# Patient Record
Sex: Male | Born: 1980 | Race: White | Hispanic: No | Marital: Married | Smoking: Never smoker
Health system: Southern US, Community
[De-identification: ages and names within clinical notes are randomized; demographics above are authoritative.]

## PROBLEM LIST (undated history)

## (undated) DIAGNOSIS — G479 Sleep disorder, unspecified: Secondary | ICD-10-CM

## (undated) DIAGNOSIS — M47812 Spondylosis without myelopathy or radiculopathy, cervical region: Principal | ICD-10-CM

## (undated) DIAGNOSIS — R42 Dizziness and giddiness: Secondary | ICD-10-CM

## (undated) DIAGNOSIS — G4709 Other insomnia: Secondary | ICD-10-CM

## (undated) HISTORY — PX: TYMPANOSTOMY TUBE PLACEMENT: SHX32

---

## 2008-05-26 ENCOUNTER — Emergency Department (HOSPITAL_COMMUNITY): Admission: EM | Admit: 2008-05-26 | Discharge: 2008-05-26 | Payer: Self-pay | Admitting: Emergency Medicine

## 2011-01-11 NOTE — Op Note (Signed)
[name], [name]                 ACCOUNT NO.:  [id number]   MEDICAL RECORD NO.:  [id number]          PATIENT TYPE:  EMS   LOCATION:  ED                           FACILITY:  [facility]   PHYSICIAN:  John C. Madilyn Fireman, M.D.    DATE OF BIRTH:  1980-10-16   DATE OF PROCEDURE:  05/26/2008  DATE OF DISCHARGE:  05/26/2008                               OPERATIVE REPORT   Esophagogastroduodenoscopy with foreign body removal.   INDICATIONS FOR PROCEDURE:  Dysphagia and sensation of obstructed  esophagus while eating roast beef tonight.   PROCEDURE:  The patient was placed in the left lateral decubitus  position and placed on the pulse monitor with continuous low-flow oxygen  delivered by nasal cannula.  He was sedated with 75 mcg IV fentanyl and  10 mg IV Versed.  Olympus video endoscope was advanced under direct  vision into the oropharynx and esophagus.  The esophagus was straight  and of normal caliber proximally with solid food bolus obstructing the  distal esophagus.  Attempt was made to remove it with the snare, but it  did not remove.  Also, a Roth basket was placed, and initially I was  unable to ensnare it.  However, with pressure on the food bolus, it  finally passed into the GE junction and into the stomach.  There  appeared to be a widely patent lower esophageal ring and some general  friability in the area.  The food bolus was clearly seen in the stomach.  Cursory inspection of the stomach was unremarkable.  The esophagus was  carefully inspected and, although somewhat friable where the food bolus  had been, only a widely patent lower esophageal ring seen with no other  significant food in the esophagus.  Scope was then withdrawn and the  patient returned to the recovery room in stable condition.  He tolerated  the procedure well.  There were no immediate complications.   IMPRESSION:  Impacted food bolus in the esophagus, dislodged with the  endoscope.   PLAN:  Clear liquid diet for  12 hours and caution regarding chewing  solid food well before swallowing in the future.  If this occurs in the  future, will probably need esophageal dilatation.           ______________________________  Everardo All Madilyn Fireman, M.D.     [facility]MED[facility]  D:  05/26/2008  T:  05/27/2008  Job:  [id number]

## 2017-06-02 ENCOUNTER — Emergency Department (HOSPITAL_BASED_OUTPATIENT_CLINIC_OR_DEPARTMENT_OTHER)
Admission: EM | Admit: 2017-06-02 | Discharge: 2017-06-02 | Disposition: A | Discharge status: Home / Self Care | Payer: 59 | Attending: Emergency Medicine | Admitting: Emergency Medicine

## 2017-06-02 ENCOUNTER — Emergency Department (HOSPITAL_BASED_OUTPATIENT_CLINIC_OR_DEPARTMENT_OTHER): Payer: 59

## 2017-06-02 ENCOUNTER — Encounter (HOSPITAL_BASED_OUTPATIENT_CLINIC_OR_DEPARTMENT_OTHER): Payer: Self-pay | Admitting: *Deleted

## 2017-06-02 DIAGNOSIS — M545 Low back pain: Secondary | ICD-10-CM | POA: Diagnosis not present

## 2017-06-02 DIAGNOSIS — R1031 Right lower quadrant pain: Secondary | ICD-10-CM

## 2017-06-02 LAB — CBC WITH DIFFERENTIAL/PLATELET
BASOS ABS: 0 10*3/uL (ref 0.0–0.1)
BASOS PCT: 0 %
EOS ABS: 0.3 10*3/uL (ref 0.0–0.7)
EOS PCT: 4 %
HCT: 48.1 % (ref 39.0–52.0)
Hemoglobin: 16.8 g/dL (ref 13.0–17.0)
Lymphocytes Relative: 27 %
Lymphs Abs: 2.1 10*3/uL (ref 0.7–4.0)
MCH: 29.9 pg (ref 26.0–34.0)
MCHC: 34.9 g/dL (ref 30.0–36.0)
MCV: 85.7 fL (ref 78.0–100.0)
MONO ABS: 0.7 10*3/uL (ref 0.1–1.0)
Monocytes Relative: 9 %
Neutro Abs: 4.7 10*3/uL (ref 1.7–7.7)
Neutrophils Relative %: 60 %
PLATELETS: 199 10*3/uL (ref 150–400)
RBC: 5.61 MIL/uL (ref 4.22–5.81)
RDW: 13.2 % (ref 11.5–15.5)
WBC: 7.9 10*3/uL (ref 4.0–10.5)

## 2017-06-02 LAB — COMPREHENSIVE METABOLIC PANEL
ALBUMIN: 4.4 g/dL (ref 3.5–5.0)
ALK PHOS: 38 U/L (ref 38–126)
ALT: 40 U/L (ref 17–63)
ANION GAP: 6 (ref 5–15)
AST: 25 U/L (ref 15–41)
BILIRUBIN TOTAL: 0.9 mg/dL (ref 0.3–1.2)
BUN: 14 mg/dL (ref 6–20)
CALCIUM: 9.3 mg/dL (ref 8.9–10.3)
CO2: 26 mmol/L (ref 22–32)
CREATININE: 1.13 mg/dL (ref 0.61–1.24)
Chloride: 103 mmol/L (ref 101–111)
GFR calc Af Amer: >60 mL/min (ref >60)
GFR calc non Af Amer: >60 mL/min (ref >60)
GLUCOSE: 93 mg/dL (ref 65–99)
Potassium: 4 mmol/L (ref 3.5–5.1)
Sodium: 135 mmol/L (ref 135–145)
TOTAL PROTEIN: 7.7 g/dL (ref 6.5–8.1)

## 2017-06-02 LAB — URINALYSIS, ROUTINE W REFLEX MICROSCOPIC
BILIRUBIN URINE: NEGATIVE
Glucose, UA: NEGATIVE mg/dL
Hgb urine dipstick: NEGATIVE
Ketones, ur: NEGATIVE mg/dL
Leukocytes, UA: NEGATIVE
NITRITE: NEGATIVE
PH: 6.5 (ref 5.0–8.0)
Protein, ur: NEGATIVE mg/dL
SPECIFIC GRAVITY, URINE: 1.01 (ref 1.005–1.030)

## 2017-06-02 MED ORDER — MORPHINE SULFATE (PF) 4 MG/ML IV SOLN
4.0000 mg | Freq: Once | INTRAVENOUS | Status: AC
  Administered 2017-06-02: 4 mg via INTRAVENOUS
  Filled 2017-06-02: qty 1

## 2017-06-02 MED ORDER — LIDOCAINE 5 % EX PTCH: 1.0000 | MEDICATED_PATCH | CUTANEOUS | 0 refills | Status: AC

## 2017-06-02 MED ORDER — IOPAMIDOL (ISOVUE-300) INJECTION 61%
100.0000 mL | Freq: Once | INTRAVENOUS | Status: AC | PRN
  Administered 2017-06-02: 100 mL via INTRAVENOUS

## 2017-06-02 MED ORDER — SODIUM CHLORIDE 0.9 % IV BOLUS (SEPSIS)
1000.0000 mL | Freq: Once | INTRAVENOUS | Status: AC
  Administered 2017-06-02: 1000 mL via INTRAVENOUS

## 2017-06-02 NOTE — ED Triage Notes (Signed)
Pt c/o right lower back pain and right lower abd/ groin pain x 3 days, denies , n/v/d

## 2017-06-02 NOTE — Discharge Instructions (Signed)
Please see the information and instructions below regarding your visit.  Your diagnoses today include:  1. Right lower quadrant pain   2. Right low back pain, unspecified chronicity, with sciatica presence unspecified    Your exam and testing is reassuring today. There were no signs of kidney stones, appendicitis, inflammation your colon, dilation of the kidneys, or problems with the major arteries of the abdomen.  Tests performed today include: See side panel of your discharge paperwork for testing performed today. Vital signs are listed at the bottom of these instructions.   Copies provided.  Medications prescribed:    Take any prescribed medications only as prescribed, and any over the counter medications only as directed on the packaging.  Lidocaine patches. You may try these for low back pain. 1 patch per 24-hour period.  Home care instructions:  Please follow any educational materials contained in this packet.   Please continue warm compresses. Please maintain hydration and ensure that urine is straw colored.  Follow-up instructions: Please follow-up with your primary care provider as soon as possible for further evaluation of your symptoms if they are not completely improved. A resource guide to find a primary care physician is included in your discharge paperwork.  Return instructions:  Please return to the Emergency Department if you experience worsening symptoms.  Please return for any worsening abdominal or back pain, nausea or vomiting that is not improving, fever, or chills in the setting of your pain, urine in your bladder or bowel movements, numbness in your groin, loss of bowel or bladder control, weakness in your lower extremities, numbness in your lower extremities. Please return if you have any other emergent concerns.  Additional Information:   Your vital signs today were: BP (!) 140/100 (BP Location: Left Arm)    Pulse 60    Temp 97.9 F (36.6 C) (Oral)     Resp 18    Ht  (1.854 m)    Wt 97.5 kg (215 lb)    SpO2 99%    BMI 28.37 kg/m  If your blood pressure (BP) was elevated on multiple readings during this visit above 130 for the top number or above 80 for the bottom number, please have this repeated by your primary care provider within one month. --------------  Thank you for allowing [place] to participate in your care today. It was a pleasure taking care of you today.

## 2017-06-02 NOTE — ED Provider Notes (Signed)
[facility] Provider Note   CSN: [id number] Arrival date & time: 06/02/17  0908     History   Chief Complaint Chief Complaint  Patient presents with  . Back Pain    HPI Alan [name] is a 36 y.o. male.  HPI   Patient is a 36 y.o. male with no abdominal surgical history and remote history of food bolus impaction presenting for 3-4 days of sharp right lower back pain and RLQ pain. Pain began with right lower back pain approximately 6 out of 10 and constant in severity. Right lower quadrant pain began one day ago and is intermittent. Patient reports some radiation to the groin. Patient has tried warm compresses, Advil, and a prior prescription for oxycodone last night with minimal relief. Patient reports he had this pain in the past a few years ago, but it resolved spontaneously and he was never evaluated for it. Patient denies any fever, chills, diarrhea changes in bowel movements, melena, hematochezia, testicular pain, dysuria, urgency, frequency, or hematuria. No weakness, numbness, paresthesias in the lower extremities, no saddle anesthesia, no loss of bowel/bladder control or urinary retention. Patient has a istory approximately 10 years ago of grade 1 spondylolisthesis, which was treated with PT and pt had no further sequelae from. No family history of appendicitis  History reviewed. No pertinent past medical history.  There are no active problems to display for this patient.   Past Surgical History:  Procedure Laterality Date  . TYMPANOSTOMY TUBE PLACEMENT         Home Medications    Prior to Admission medications   Not on File    Family History History reviewed. No pertinent family history.  Social History Social History  Substance Use Topics  . Smoking status: Never Smoker  . Smokeless tobacco: Never Used  . Alcohol use No     Allergies   Patient has no known allergies.   Review of Systems Review of Systems  Constitutional: Negative for  chills and fever.  HENT: Positive for congestion and rhinorrhea.   Eyes: Negative for visual disturbance.  Respiratory: Positive for cough. Negative for shortness of breath.        Nonproductive cough  Cardiovascular: Negative for chest pain and leg swelling.  Gastrointestinal: Positive for abdominal pain and constipation. Negative for blood in stool, diarrhea, nausea and vomiting.  Genitourinary: Negative for dysuria, flank pain, frequency, hematuria, testicular pain and urgency.  Musculoskeletal: Negative for back pain and myalgias.  Skin: Negative for rash.  Neurological: Positive for headaches. Negative for weakness and numbness.     Physical Exam Updated Vital Signs BP (!) 140/100 (BP Location: Left Arm)   Pulse 60   Temp 97.9 F (36.6 C) (Oral)   Resp 18   Ht  (1.854 m)   Wt 97.5 kg (215 lb)   SpO2 99%   BMI 28.37 kg/m   Physical Exam  Constitutional: He appears well-developed and well-nourished. No distress.  HENT:  Head: Normocephalic and atraumatic.  Mouth/Throat: Oropharynx is clear and moist.  Eyes: Pupils are equal, round, and reactive to light. Conjunctivae and EOM are normal.  Neck: Normal range of motion. Neck supple.  Cardiovascular: Normal rate, regular rhythm, S1 normal and S2 normal.   No murmur heard. Pulmonary/Chest: Effort normal and breath sounds normal. He has no wheezes. He has no rales.  Abdominal: Soft. He exhibits no distension. There is tenderness. There is no guarding.  Tender to palpation in the right lower quadrant. Palpation of  left lower quadrant produces pain in right lower quadrant. No CVA tenderness.  Musculoskeletal: Normal range of motion. He exhibits no edema or deformity.  Lymphadenopathy:    He has no cervical adenopathy.  Neurological: He is alert.  Cranial nerves grossly intact. Spine Exam: Inspection/Palpation: No tenderness to palpation of midline cervical, thoracic, or lumbar spine. No tenderness to palpation of lumbar  paraspinal musculature. Strength: 5/5 throughout LE bilaterally (hip flexion/extension, adduction/abduction; knee flexion/extension; foot dorsiflexion/plantarflexion, inversion/eversion; great toe inversion) Sensation: Intact to light touch in proximal and distal LE bilaterally Reflexes: 2+ quadriceps reflex and symmetric Gait: Symmetric and with good coordination.  Skin: Skin is warm and dry. No rash noted. No erythema.  Psychiatric: He has a normal mood and affect. His behavior is normal. Judgment and thought content normal.  Nursing note and vitals reviewed.    ED Treatments / Results  Labs (all labs ordered are listed, but only abnormal results are displayed) Labs Reviewed  URINALYSIS, ROUTINE W REFLEX MICROSCOPIC  COMPREHENSIVE METABOLIC PANEL  CBC WITH DIFFERENTIAL/PLATELET    EKG  EKG Interpretation None       Radiology No results found.  Procedures Procedures (including critical care time)  Medications Ordered in ED Medications  sodium chloride 0.9 % bolus 1,000 mL (not administered)  morphine 4 MG/ML injection 4 mg (not administered)     Initial Impression / Assessment and Plan / ED Course  I have reviewed the triage vital signs and the nursing notes.  Pertinent labs & imaging results that were available during my care of the patient were reviewed by me and considered in my medical decision making (see chart for details).  Clinical Course as of Jun 02 1004  Fri Jun 02, 2017  4098 Patient verbally verified a safe ride from the ED. Proceeded with prescribing morphine for pain in the ED.   [AM]    Clinical Course User Index [AM] Elisha Ponder, PA-C    Final Clinical Impressions(s) / ED Diagnoses   Final diagnoses:  None   Differential diagnosis includes nephrolithiasis, appendicitis, pyelonephritis, right-sided diverticulitis, constipation, renal infarct, AAA, low back strain, lumbar disc compression. Patient Greatest suspicion for nephrolithiasis or  appendicitis at this time. Patient is well-appearing, afebrile, and in no acute distress. Morphine for pain control. time. Will obtain labs including contrast enhanced CT.  Patient reevaluated and noted to have improvement with morphine. Patient reported that he had some return of his pain towards the end of the visit in the back, but resolution of the right lower abdominal pain. Patient has a benign abdomen, is afebrile and is well-appearing. CT without any acute abnormality. Shows mild enlarged spleen but in the absence of malaise, hematologic abnormality, or recent illness, do not suspect acute pathology with this finding. No leukocytosis. Normal kidney function, normal liver function, and normal urinalysis. Suspect possible pathology related to history of spondylolisthesis and potential compression of nerves, but no surgical concern today. Patient is neurologically intact in the lower extremities. Patient instructed to follow-up with a primary care provider and establish care. Patient is in understanding and agrees with the plan of care.  This is a shared visit with Dr. Verdie Mosher. Patient was independently evaluated by this attending physician. Attending physician consulted in evaluation and discharge management.  New Prescriptions New Prescriptions   No medications on file     Delia Chimes 06/02/17 1214    Lavera Guise, MD 06/02/17 [phone]

## 2017-06-02 NOTE — ED Provider Notes (Signed)
Medical screening examination/treatment/procedure(s) were conducted as a shared visit with non-physician practitioner(s) and myself.  I personally evaluated the patient during the encounter.   EKG Interpretation None      36 year old male who presents with RLQ abdominal pain and back pain. No prior abdominal surgeries. Onset of right lower back pain 3-4 days ago, now traveling to the RLQ. No n/v/d, fever, dysuria, urinary frequency, hematuria, testicular swelling or testicular pain. Afebrile, well appearing. Abdomen soft and non-peritoneal. Focal RLQ tenderness on abdominal exam, no significant CVA tenderness. UA without blood or infection. Will obtain CT abd/pelvis to evaluate for appendicitis, renal stone, or other acute processes. Blood work pending.  Blood work reassuring. CT visualized. Normal. No evidence of appendicitis, kidney stones or other acute pathology. Given benign exam, patient will continue supportive care with OTC analgesics. To return to ED for worsening or other new concerning symptoms.   Strict return and follow-up instructions reviewed. He expressed understanding of all discharge instructions and felt comfortable with the plan of care.    Lavera Guise, MD 06/02/17 [phone]

## 2017-06-02 NOTE — ED Notes (Signed)
ED Provider at bedside. 

## 2017-06-02 NOTE — ED Notes (Signed)
Patient transported to CT 

## 2017-06-02 NOTE — ED Notes (Signed)
Pt refused wheelchair.  

## 2019-01-18 IMAGING — CT CT ABD-PELV W/ CM
2 of 4 series · 16 of 46 positions shown, 18 images · IV contrast (APPLIED)
Comparison: None.

CLINICAL DATA: Abdominal pain

EXAM:
CT ABDOMEN AND PELVIS WITH CONTRAST
TECHNIQUE: Multidetector CT imaging of the abdomen and pelvis was performed
using the standard protocol following bolus administration of
intravenous contrast. Oral contrast was also administered.
CONTRAST:  100mL 1J19TR-WCC IOPAMIDOL (1J19TR-WCC) INJECTION 61%

[Series 2: axial st · axial · 0.92mm/px · z∈[-599,-84]mm · 13 of 113 slices shown, 15 images]
[im 5/113  soft-tissue]
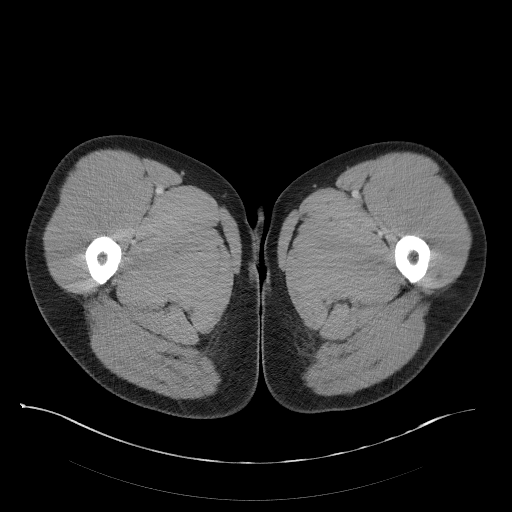
[im 5/113  bone]
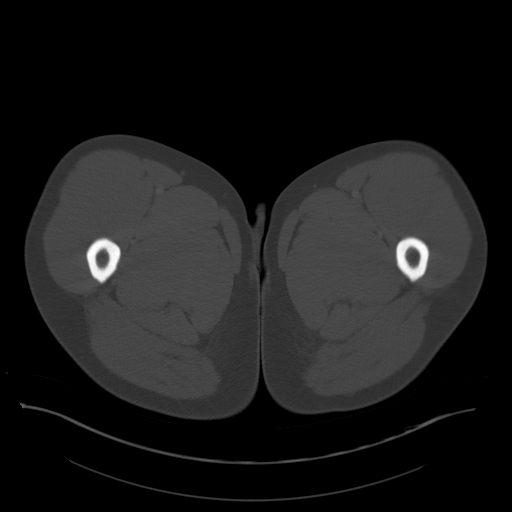
[im 15/113  soft-tissue]
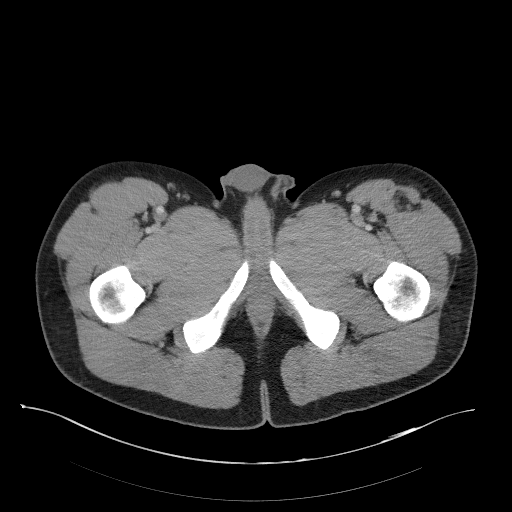
[im 24/113  soft-tissue]
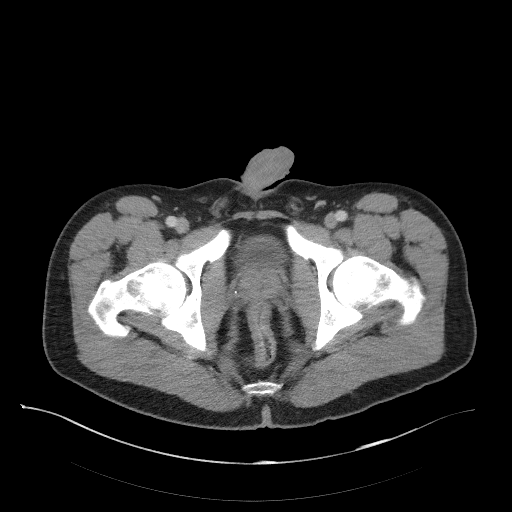
[im 33/113  soft-tissue]
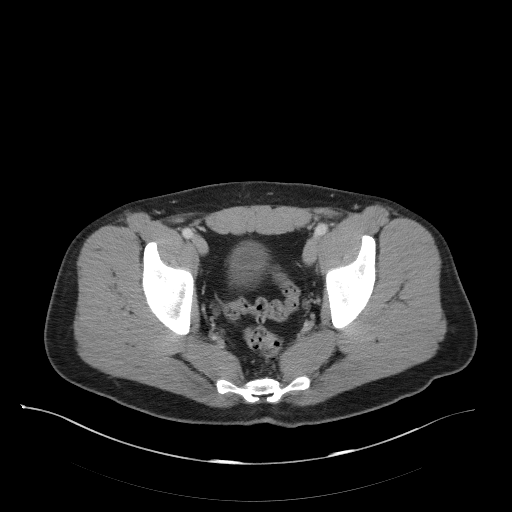
[im 38/113  soft-tissue]
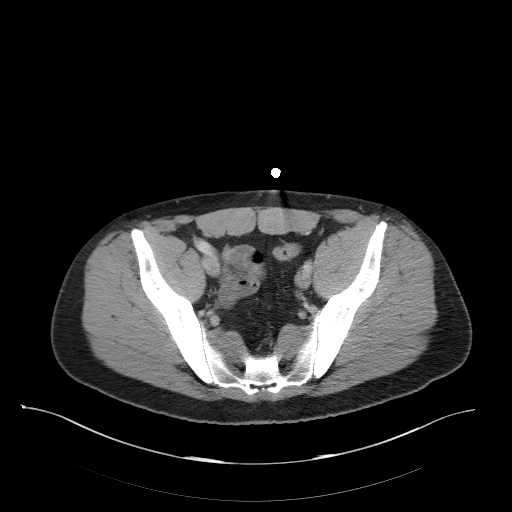
[im 47/113  soft-tissue]
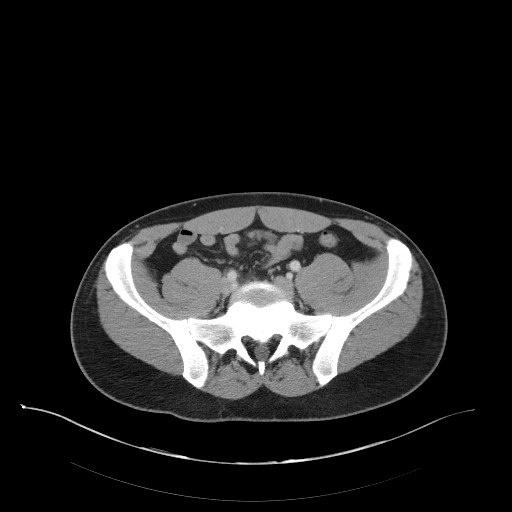
[im 57/113  soft-tissue]
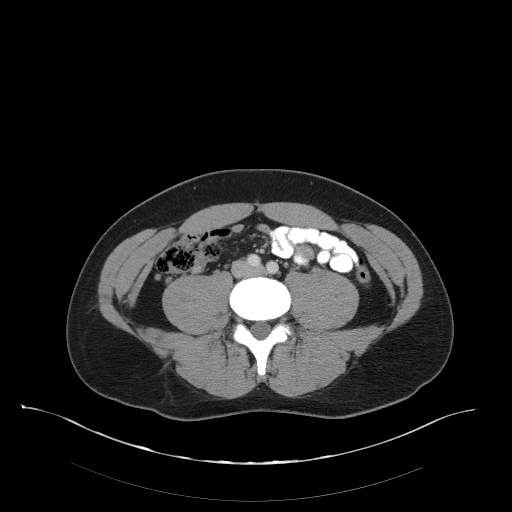
[im 66/113  soft-tissue]
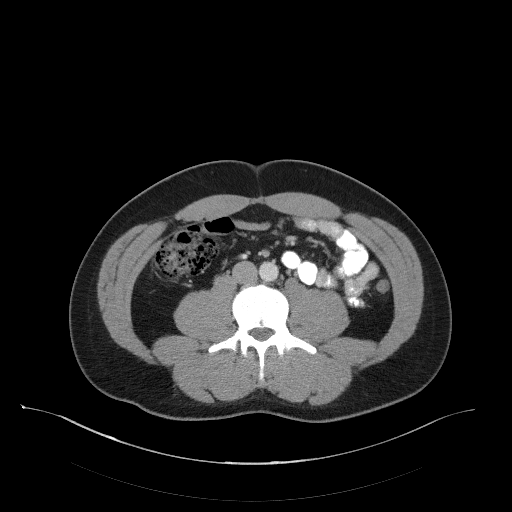
[im 75/113  soft-tissue]
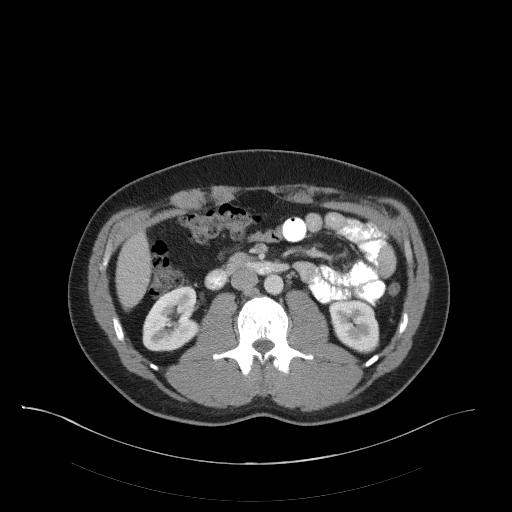
[im 75/113  bone]
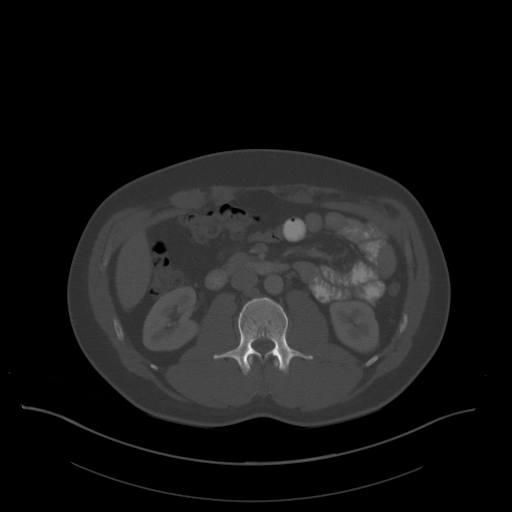
[im 80/113  soft-tissue]
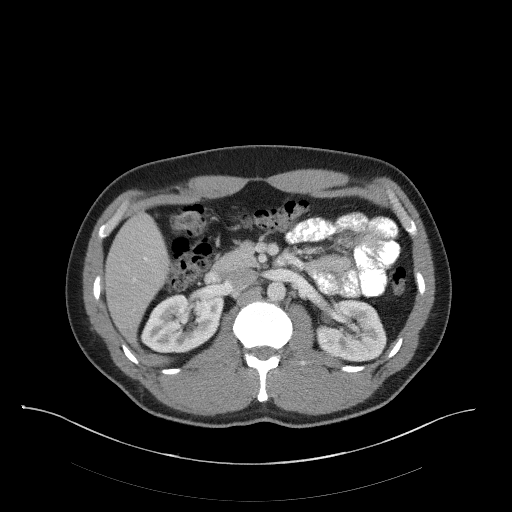
[im 89/113  soft-tissue]
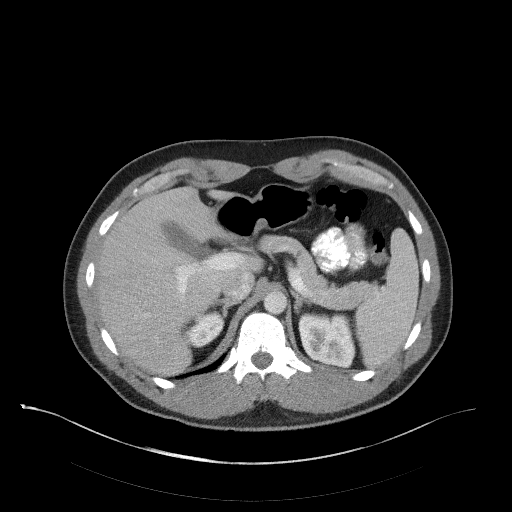
[im 99/113  soft-tissue]
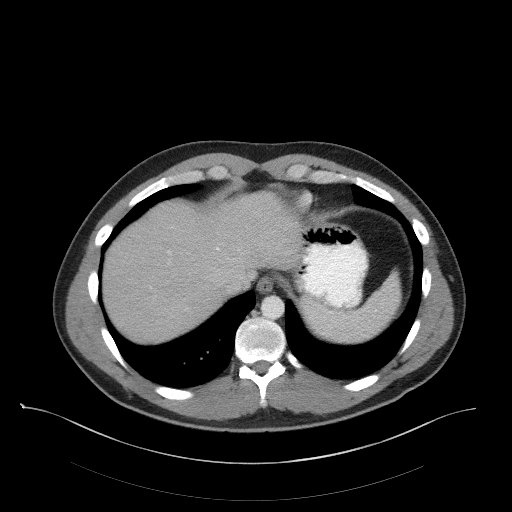
[im 108/113  soft-tissue]
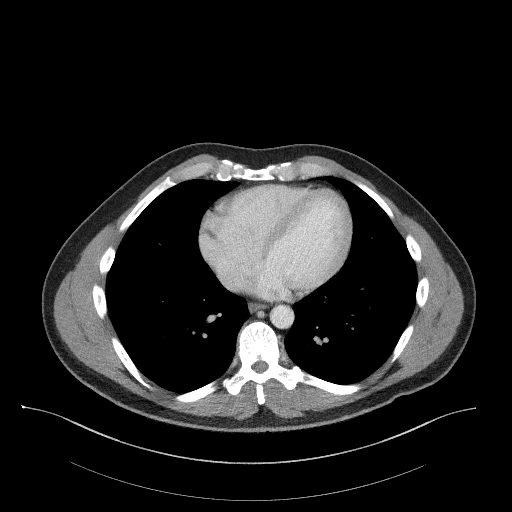

[Series 5: coronal st · coronal · 0.94mm/px · 3 of 81 slices shown]
[im 27/81  soft-tissue]
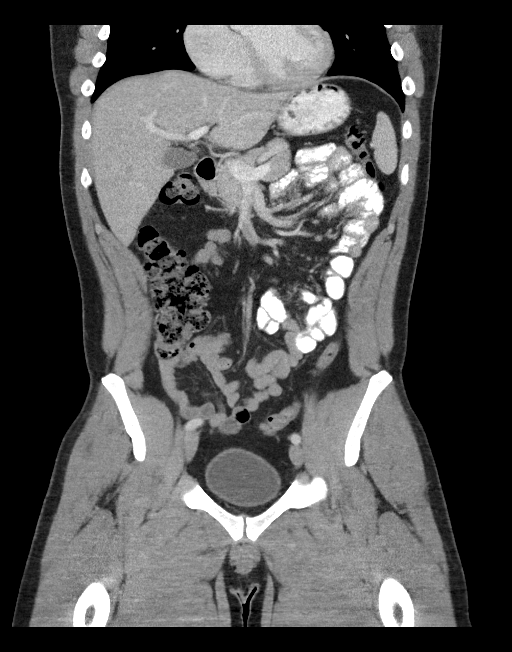
[im 36/81  soft-tissue]
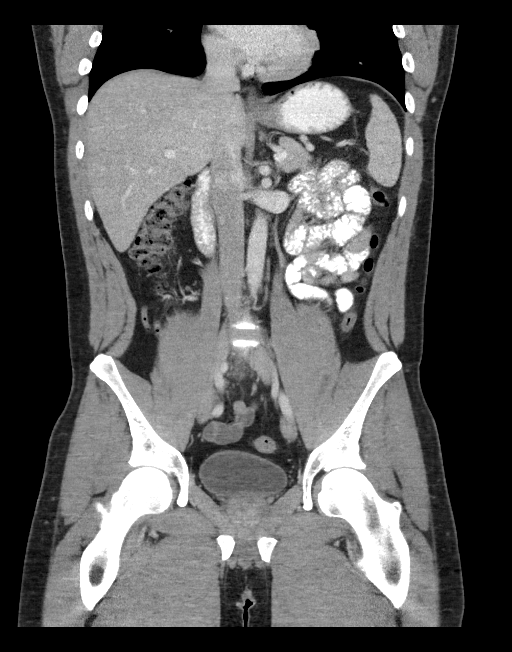
[im 45/81  soft-tissue]
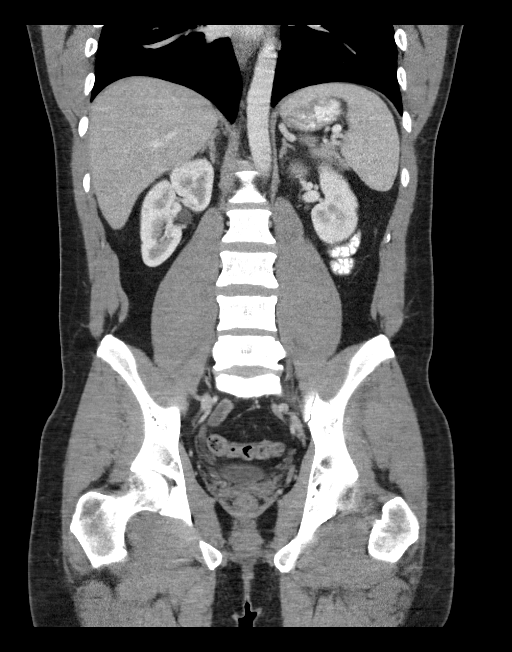

[16 of 46 positions shown; findings below may reference images not displayed]

FINDINGS: Lower chest:  Lung bases are clear.

Hepatobiliary: There is mild fatty infiltration near the fissure for
the ligamentum teres. No focal liver lesions are evident.
Gallbladder wall is not thickened. There is no biliary duct
dilatation.

Pancreas:  There is no pancreatic mass or inflammatory focus.

Spleen: No splenic lesions are appreciable. Spleen measures 13.1 x
12.1 x 5.9 cm with a measured splenic volume of 468 cubic cm.

Adrenals/Urinary Tract: Adrenals appear normal bilaterally. Kidneys
bilaterally show no mass or hydronephrosis on either side. There is
known more ureteral calculus on either side. Urinary bladder is
midline with wall thickness within normal limits.

Stomach/Bowel: There are occasional sigmoid diverticula. No
diverticulitis. There is no bowel wall or mesenteric thickening. No
bowel obstruction. No free air or portal venous air.

Vascular/Lymphatic: No abdominal aortic aneurysm. No vascular
lesions are evident. No adenopathy is appreciable in the abdomen or
pelvis.

Reproductive: Prostate and seminal vesicles are normal in size and
contour. No pelvic masses evident.

Other: Appendix appears normal. There is no abscess or ascites in
the abdomen or pelvis.

Musculoskeletal: No blastic or lytic bone lesions are evident. No
intramuscular or abdominal wall lesion evident.
IMPRESSION: 1.  Spleen mildly prominent without focal splenic lesion.

2. Occasional sigmoid diverticula without diverticulitis. No bowel
obstruction or bowel wall thickening.

3.  No renal or ureteral calculus.  No hydronephrosis.

4.  Appendix appears normal.  No evident abscess.

## 2019-07-08 ENCOUNTER — Ambulatory Visit: Admit: 2019-07-08 | Discharge: 2019-07-08 | Attending: Occupational Health

## 2019-07-08 ENCOUNTER — Ambulatory Visit: Attending: Occupational Health | Primary: Family Medicine

## 2019-07-08 DIAGNOSIS — Z111 Encounter for screening for respiratory tuberculosis: Secondary | ICD-10-CM

## 2019-07-08 NOTE — Progress Notes (Signed)
HISTORY OF PRESENT ILLNESS  Terry Blanchard is a 38 y.o. male.  HPI   Needs a PPD placed for his yearly screening for TB.  He is not on immunosuppressants or steroids.  No past history of TB exposure or positive screening.  ROS    Physical Exam   No allergies or adverse reactions to TB screening  No Known Allergies    ASSESSMENT and PLAN  Screening for TB  Diagnoses and all orders for this visit:    1. Screening for tuberculosis  -     AMB POC TUBERCULOSIS, INTRADERMAL (SKIN TEST)     PPD placed in left Forearm, follow up in 48-72 hours for reading

## 2019-07-08 NOTE — Progress Notes (Signed)
HISTORY OF PRESENT ILLNESS  Terry Blanchard is a 38 y.o. male.  HPI   Needs a PPD placed for his yearly screening for TB.  He is not on immunosuppressants or steroids.  No past history of TB exposure or positive screening.  ROS    Physical Exam   No allergies or adverse reactions to TB screening  No Known Allergies    ASSESSMENT and PLAN  Screening for TB  Diagnoses and all orders for this visit:    1. Screening for tuberculosis  -     AMB POC TUBERCULOSIS, INTRADERMAL (SKIN TEST)     PPD placed in left Forearm, follow up in 48-72 hours for reading

## 2019-11-05 ENCOUNTER — Encounter: Attending: Urology

## 2020-01-29 ENCOUNTER — Ambulatory Visit: Admit: 2020-01-29 | Discharge: 2020-01-29 | Payer: PRIVATE HEALTH INSURANCE | Attending: Urology

## 2020-01-29 ENCOUNTER — Ambulatory Visit: Attending: Urology | Primary: Family Medicine

## 2020-01-29 DIAGNOSIS — Z3009 Encounter for other general counseling and advice on contraception: Secondary | ICD-10-CM

## 2020-01-29 MED ORDER — DIAZEPAM 10 MG TAB
10 mg | ORAL_TABLET | ORAL | 0 refills | Status: AC
Start: 2020-01-29 — End: ?

## 2020-01-29 NOTE — Progress Notes (Signed)
[facility]  [address]  [place], [place] [address]  [phone]    Terry Blanchard  DOB: 1981-07-24      Chief Complaint   Patient presents with   ??? New Patient   ??? Vasectomy   ??? Counseling        HPI   39 y.o., male who is referred by self for family planning.  He has four kids.  Remarried.  Vascular rep.      No past medical history on file.  No past surgical history on file.    No Known Allergies  Social History     Socioeconomic History   ??? Marital status: MARRIED     Spouse name: Not on file   ??? Number of children: Not on file   ??? Years of education: Not on file   ??? Highest education level: Not on file   Occupational History   ??? Not on file   Tobacco Use   ??? Smoking status: Not on file   Substance and Sexual Activity   ??? Alcohol use: Not on file   ??? Drug use: Not on file   ??? Sexual activity: Not on file   Other Topics Concern   ??? Not on file   Social History Narrative   ??? Not on file     Social Determinants of Health     Financial Resource Strain:    ??? Difficulty of Paying Living Expenses:    Food Insecurity:    ??? Worried About Programme researcher, broadcasting/film/video in the Last Year:    ??? Barista in the Last Year:    Transportation Needs:    ??? Freight forwarder (Medical):    ??? Lack of Transportation (Non-Medical):    Physical Activity:    ??? Days of Exercise per Week:    ??? Minutes of Exercise per Session:    Stress:    ??? Feeling of Stress :    Social Connections:    ??? Frequency of Communication with Friends and Family:    ??? Frequency of Social Gatherings with Friends and Family:    ??? Attends Religious Services:    ??? Database administrator or Organizations:    ??? Attends Engineer, structural:    ??? Marital Status:    Intimate Programme researcher, broadcasting/film/video Violence:    ??? Fear of Current or Ex-Partner:    ??? Emotionally Abused:    ??? Physically Abused:    ??? Sexually Abused:      No family history on file.      Review of Systems  Constitutional: Negative  Skin: Negative skin ROS  Eyes: Eyes negative  ENT: HENT negative   Respiratory: Respiratory negative  Cardiovascular: Neg cardio ROS  GI: Neg GI ROS  Genitourinary: Genitourinary negative  Musculoskeletal: Positive for neck pain.  Neurological: Neg neuro ROS  Psychological: Neg psych ROS  Endocrine: Endocrine negative  Hem/Lymphatic: Hematologic/lymphatic negative        Physical Exam  Visit Vitals  BP 136/88   Pulse 67   Ht 6\' 1"  (1.854 m)   Wt 222 lb (100.7 kg)   BMI 29.29 kg/m??     General appearance - alert, well appearing, and in no distress  Mental status - alert, oriented to person, place, and time  Eyes - extraocular eye movements intact, sclera anicteric  GU- both vas palpable  Neurological -  normal speech, no focal findings or movement disorder noted  Skin -  normal coloration and turgor      Assessment/Plan    ICD-10-CM ICD-9-CM    1. Encounter for vasectomy counseling  Z30.09 V25.09      All risks, benefits and alternatives to the procedure were reviewed in detail with the patient and he is willing to proceed with a vasectomy.  I did inform him that he is not cleared to discontinue contraception until he produces at least one semen analysis with azoospermia or rare non motile sperm following his vasectomy.  I recommended that he bring his first post vasectomy semen specimen into the office no sooner than 8 weeks post op .  I also informed him that this must be considered a permanent procedure.  Should fertility be desired post vasectomy his options include vasectomy reversal and/or sperm retrieval.  I explained that these options can be expensive and not always successful.   We discussed possible side effects during and following his vasectomy which include but are not limited to; bleeding, infection, hydrocele, sperm granuloma, acute pain, chronic pain, and/or testicular atrophy.  I discussed the possibility performing a unilateral vasectomy twice which would lead to persistent sperm and the need for a redo vasectomy.  I also discussed the risk of recanalization and  the risk of pregnancy despite initial clearance of sperm and approval to stop contraception.  Rx provided for Valium 10mg  tab #1 to be taken one hr prior to the procedure.  Pt was instructed not to drive on this medication.    Terry Booze [name], DO

## 2020-01-29 NOTE — Progress Notes (Signed)
[facility] [place] [address]  [phone]    Terry Blanchard  DOB: 1980-09-27      Chief Complaint   Patient presents with   ??? New Patient   ??? Vasectomy   ??? Counseling        HPI   39 y.o., male who is referred by self for family planning.  He has four kids.  Remarried.  Vascular rep.      No past medical history on file.  No past surgical history on file.    No Known Allergies  Social History     Socioeconomic History   ??? Marital status: MARRIED     Spouse name: Not on file   ??? Number of children: Not on file   ??? Years of education: Not on file   ??? Highest education level: Not on file   Occupational History   ??? Not on file   Tobacco Use   ??? Smoking status: Not on file   Substance and Sexual Activity   ??? Alcohol use: Not on file   ??? Drug use: Not on file   ??? Sexual activity: Not on file   Other Topics Concern   ??? Not on file   Social History Narrative   ??? Not on file     Social Determinants of Health     Financial Resource Strain:    ??? Difficulty of Paying Living Expenses:    Food Insecurity:    ??? Worried About Charity fundraiser in the Last Year:    ??? Arboriculturist in the Last Year:    Transportation Needs:    ??? Film/video editor (Medical):    ??? Lack of Transportation (Non-Medical):    Physical Activity:    ??? Days of Exercise per Week:    ??? Minutes of Exercise per Session:    Stress:    ??? Feeling of Stress :    Social Connections:    ??? Frequency of Communication with Friends and Family:    ??? Frequency of Social Gatherings with Friends and Family:    ??? Attends Religious Services:    ??? Marine scientist or Organizations:    ??? Attends Music therapist:    ??? Marital Status:    Intimate Production manager Violence:    ??? Fear of Current or Ex-Partner:    ??? Emotionally Abused:    ??? Physically Abused:    ??? Sexually Abused:      No family history on file.      Review of Systems  Constitutional: Negative  Skin: Negative skin ROS  Eyes: Eyes negative  ENT: HENT  negative  Respiratory: Respiratory negative  Cardiovascular: Neg cardio ROS  GI: Neg GI ROS  Genitourinary: Genitourinary negative  Musculoskeletal: Positive for neck pain.  Neurological: Neg neuro ROS  Psychological: Neg psych ROS  Endocrine: Endocrine negative  Hem/Lymphatic: Hematologic/lymphatic negative        Physical Exam  Visit Vitals  BP 136/88   Pulse 67   Ht 6\' 1"  (1.854 m)   Wt 222 lb (100.7 kg)   BMI 29.29 kg/m??     General appearance - alert, well appearing, and in no distress  Mental status - alert, oriented to person, place, and time  Eyes - extraocular eye movements intact, sclera anicteric  GU- both vas palpable  Neurological -  normal speech, no focal findings or movement disorder noted  Skin -  normal coloration and turgor      Assessment/Plan    ICD-10-CM ICD-9-CM    1. Encounter for vasectomy counseling  Z30.09 V25.09      All risks, benefits and alternatives to the procedure were reviewed in detail with the patient and he is willing to proceed with a vasectomy.  I did inform him that he is not cleared to discontinue contraception until he produces at least one semen analysis with azoospermia or rare non motile sperm following his vasectomy.  I recommended that he bring his first post vasectomy semen specimen into the office no sooner than 8 weeks post op .  I also informed him that this must be considered a permanent procedure.  Should fertility be desired post vasectomy his options include vasectomy reversal and/or sperm retrieval.  I explained that these options can be expensive and not always successful.   We discussed possible side effects during and following his vasectomy which include but are not limited to; bleeding, infection, hydrocele, sperm granuloma, acute pain, chronic pain, and/or testicular atrophy.  I discussed the possibility performing a unilateral vasectomy twice which would lead to persistent sperm and the need for a redo vasectomy.  I also discussed the risk of  recanalization and the risk of pregnancy despite initial clearance of sperm and approval to stop contraception.  Rx provided for Valium 10mg  tab #1 to be taken one hr prior to the procedure.  Pt was instructed not to drive on this medication.    Atiyana Welte, DO

## 2020-02-11 ENCOUNTER — Ambulatory Visit: Admit: 2020-02-11 | Discharge: 2020-02-11 | Payer: PRIVATE HEALTH INSURANCE | Attending: Urology

## 2020-02-11 ENCOUNTER — Ambulatory Visit: Attending: Urology | Primary: Family Medicine

## 2020-02-11 DIAGNOSIS — Z302 Encounter for sterilization: Secondary | ICD-10-CM

## 2020-02-11 MED ORDER — HYDROCODONE-ACETAMINOPHEN 5 MG-325 MG TAB
5-325 mg | ORAL_TABLET | ORAL | 0 refills | Status: AC | PRN
Start: 2020-02-11 — End: 2020-02-14

## 2020-02-11 MED ORDER — CIPROFLOXACIN 500 MG TAB
500 mg | ORAL_TABLET | Freq: Two times a day (BID) | ORAL | 0 refills | Status: AC
Start: 2020-02-11 — End: 2020-02-14

## 2020-02-11 NOTE — Progress Notes (Signed)
Vasectomy Procedure Note    HPI: The patient is a 38 y.o. male who is here for a Vasectomy procedure.    Procedure Details:    Patient consent was obtained.  His genital area was prepped and draped and a sterile field applied.  The right vas deferens was isolated and brought up to the scrotal skin.  This area was infiltrated using 1% lidocaine.  A small incision was made and the vas deferens was externalized using a ring forceps.  The vasal sheath was incised longitudinally and dissected off the underlying vas deferens.  Once isolated, the vas deferens was clamped proximally and distally using hemostats.  A small segment of vas deferens was then excised.  A small titanium clip was applied to the proximal and distal vas deferens segments and the cut edges were fulgerated using electrocautery.  Following hemostasis, the vas segments were replaced in the scrotum and the small incision was closed using a 4-0 chromic suture in an interrupted fashion.  A similar procedure was then performed on the left side.  The patient tolerated the procedure well.      Plan:  1.   Discharge instructions were reviewed with the patient.  No driving immediately following the procedure and within 24 hours of taking narcotics.  Strict bedrest for 48 hours.  Ice scrotal area every 15 min on and off for the first 24 hours.  No sexual activity incluiding masturbation or lifting greater than 10 lbs for one week.  Showers are permitted the day after surgery but swimming or bathing in a tube of water should be avoided for one week.  No blood thinners or anticoagulation for 72 hours.  2.  Call for any fevers, significant swelling, pain or bleeding.  3.  Prescriptions were provided for Norco 5 mg/325 mg one pill orally every 4 hours prn pain #10 and Ciprofloxacin 500 mg orally bid #6 (if not allergic) postoperatively.  The patient was instructed to take Tylenol 500 mg every 6 hours prn for mild discomfort.  4.  Follow up in one week for a  postoperative visit.  First semen check no sooner than 8 weeks post operatively.  He was again instructed to continue contraception until no sperm is seen on at least one postoperative semen check.  .

## 2020-03-04 MED ORDER — SILDENAFIL 20 MG TAB
20 mg | ORAL_TABLET | ORAL | 3 refills | Status: DC | PRN
Start: 2020-03-04 — End: 2020-06-23

## 2020-03-24 ENCOUNTER — Other Ambulatory Visit: Admit: 2020-03-24 | Discharge: 2020-03-24 | Payer: PRIVATE HEALTH INSURANCE | Attending: Urology

## 2020-03-24 NOTE — Progress Notes (Signed)
Spoke with patient, results given.

## 2020-03-24 NOTE — Progress Notes (Signed)
Semen check #1  Vasectomy by Dr. Rozann Lesches on 02/11/20    No sperm seen.  May stop contraception.  Please notify pt.//Terry Blanchard

## 2020-06-23 MED ORDER — SILDENAFIL 20 MG TAB
20 mg | ORAL_TABLET | ORAL | 3 refills | Status: DC | PRN
Start: 2020-06-23 — End: 2020-09-20

## 2020-09-01 ENCOUNTER — Encounter: Payer: PRIVATE HEALTH INSURANCE | Attending: Family Medicine

## 2020-09-21 MED ORDER — SILDENAFIL 20 MG TAB
20 mg | ORAL_TABLET | ORAL | 3 refills | Status: AC | PRN
Start: 2020-09-21 — End: ?

## 2020-10-06 ENCOUNTER — Encounter: Attending: Urology

## 2020-10-12 ENCOUNTER — Encounter: Payer: PRIVATE HEALTH INSURANCE | Attending: Urology

## 2021-02-09 MED ORDER — SILDENAFIL CITRATE 20 MG PO TABS
20 MG | ORAL_TABLET | Freq: Every day | ORAL | 5 refills | Status: AC | PRN
Start: 2021-02-09 — End: ?

## 2021-02-09 NOTE — Telephone Encounter (Signed)
Called and left a message for the pt and the rx was sent to the pharmacy//dh

## 2021-02-09 NOTE — Telephone Encounter (Signed)
-----   Message from Pryor Montes, DO sent at 02/08/2021  6:23 PM EDT -----  OK to call in.  #[phone] refills.  ----- Message -----  From: Ronn Melena, RN  Sent: 02/08/2021   9:19 AM EDT  To: Lyndle Herrlich Sterrett, DO    Please see patient's MyChart message below. Thanks!      I need a new script for sildenafil please. Could you possibly increase the quantity per month?  It's currently at 30.

## 2021-09-21 ENCOUNTER — Telehealth
Admit: 2021-09-21 | Discharge: 2021-09-21 | Payer: PRIVATE HEALTH INSURANCE | Attending: Clinical Neurophysiology | Primary: Family Medicine

## 2021-09-21 ENCOUNTER — Encounter

## 2021-09-21 DIAGNOSIS — R42 Dizziness and giddiness: Secondary | ICD-10-CM

## 2021-09-21 NOTE — Progress Notes (Signed)
Terry Blanchard (DOB:  16-Jul-1981) is a New patient, here for evaluation of the following:    Assessment & Plan   Below is the assessment and plan developed based on review of pertinent history, physical exam, labs, studies, and medications.  1. Vertigo    Initial symptoms of frequent migraine associated with vertigo suggested migraine vertigo vs certain form of mild viral infection; but now migraine reduced while vertigo remained unchanged. Also has right ear symptoms. MRI brain unremarkable. Recommended for the patient to be evaluated by ENT. Meclizine as needed. Try B2 and magnesium for migraine prevention.     Return if symptoms worsen or fail to improve.       Subjective   Headaches and dizziness. Symptoms 4-5 months mainly dizziness, hx migraine. Right earache and ringing high pitch. Migraine 2-3/week at the onset of vertigo, now 2-3/mo; but vertigo about the same frequency, duration 15-20 seconds, horizontal moving, brain fog. Standing up too quick or no reason would occur, about 3 times/day the same as the onset. No prodromal infection, no recent injury.  Took valium once for MRI.     MRI brain on 04/29/21 at [facility], pictures were reviewed with pt, tiny T2 spots total 5 in subcortical and beneath the U fibers, 3 on R and 2 on L hemispheres. Brainstem and posterior fossa normal. Hx migraine and played football younger age. Not a stroke or MS pattern.     Review of Systems   Neurological:  Positive for dizziness and headaches.         Objective   Patient-Reported Vitals  No data recorded     Physical Exam  [INSTRUCTIONS:  "[x] " Indicates a positive item  "[] " Indicates a negative item  -- DELETE ALL ITEMS NOT EXAMINED]    Constitutional: [x]  Appears well-developed and well-nourished [x]  No apparent distress      []  Abnormal -     Mental status: [x]  Alert and awake  [x]  Oriented to person/place/time [x]  Able to follow commands. Provided clear history, memory capacity was normal, no dysphasia.     []  Abnormal -      Eyes:   EOM    [x]   Normal    []  Abnormal -   Sclera  [x]   Normal    []  Abnormal -          Discharge [x]   None visible   []  Abnormal -     HENT: [x]  Normocephalic, atraumatic  []  Abnormal -   [x]      External Ears [x]  Normal hearing []  Abnormal -    Neck: [x]   []  Abnormal -     Pulmonary/Chest: [x]  Respiratory effort normal   [x]  No visualized signs of difficulty breathing or respiratory distress        []  Abnormal -      Musculoskeletal:   [x]  Normal gait with no signs of ataxia         [x]  Normal range of motion of neck        []  Abnormal -     Neurological:        [x]  No Facial Asymmetry (Cranial nerve 7 motor function) (limited exam due to video visit)          [x]  No gaze palsy        []  Abnormal -          Skin:        [x]           []  Abnormal -  Psychiatric:       [x]  Normal Affect []  Abnormal -        [x]  No Hallucinations    Other pertinent observable physical exam findings:-         On this date 09/21/2021 I have spent 40 minutes reviewing previous notes, test results and face to face (virtual) with the patient discussing the diagnosis and importance of compliance with the treatment plan as well as documenting on the day of the visit.    , was evaluated through a synchronous (real-time) audio-video encounter. The patient (or guardian if applicable) is aware that this is a billable service, which includes applicable co-pays. This Virtual Visit was conducted with patient's (and/or legal guardian's) consent. The visit was conducted pursuant to the emergency declaration under the and the 09/23/2021, 1135 waiver authority and the [employer] and [employer] Act.  Patient identification was verified, and a caregiver was present when appropriate.   The patient was located at Other: in car not driving .   Provider was located at Home (Appt Dept State): [place].        --[employer], MD

## 2021-09-22 MED ORDER — AMITRIPTYLINE HCL 10 MG PO TABS
10 | ORAL_TABLET | ORAL | 3 refills | Status: AC
Start: 2021-09-22 — End: ?

## 2021-09-22 NOTE — Telephone Encounter (Signed)
From: Chong Sicilian  To: Dr. Katharina Caper  Sent: 09/21/2021 1:03 PM EST  Subject: Sleep rx    I meant to ask on our virtual appointment earlier but forgot. My sleep is terrible. Maybe 4 hours most nights. Trouble falling asleep and trouble staying asleep. Could I possibly get a prescription to help with that? I???ve tried zinc, magnesium and melatonin with no success.

## 2021-09-22 NOTE — Telephone Encounter (Signed)
Requested Prescriptions     Signed Prescriptions Disp Refills    amitriptyline (ELAVIL) 10 MG tablet 60 tablet 3     Sig: 1 qhs, may increase to 2 qhs in 2-3 weeks as needed. May take 1 hour after dinner to avoid morning drowsiness.     Authorizing Provider: Katharina Caper

## 2021-11-17 ENCOUNTER — Encounter

## 2021-11-19 MED ORDER — TEMAZEPAM 15 MG PO CAPS
15 MG | ORAL_CAPSULE | ORAL | 2 refills | Status: DC
Start: 2021-11-19 — End: 2022-03-22

## 2021-11-19 NOTE — Telephone Encounter (Signed)
Requested Prescriptions     Signed Prescriptions Disp Refills    temazepam (RESTORIL) 15 MG capsule 60 capsule 2     Sig: 1-2 qhs prn     Authorizing Provider: Katharina Caper

## 2021-11-19 NOTE — Telephone Encounter (Signed)
From: Chong Sicilian  To: Dr. Katharina Caper  Sent: 11/17/2021 8:14 PM EDT  Subject: amitriptyline    Good evening. The amitriptyline has not been helping much with my sleep problems. Is there a different rx we can try?

## 2022-03-18 ENCOUNTER — Encounter

## 2022-03-21 ENCOUNTER — Encounter

## 2022-03-21 NOTE — Telephone Encounter (Signed)
Pt requesting a RF on pended medicatio

## 2022-03-22 MED ORDER — TEMAZEPAM 15 MG PO CAPS
15 MG | ORAL_CAPSULE | ORAL | 2 refills | Status: AC
Start: 2022-03-22 — End: 2022-09-22

## 2022-05-23 ENCOUNTER — Telehealth

## 2022-05-23 NOTE — Telephone Encounter (Signed)
Pt is requesting a rx for zolpidem (AMBIEN) 10 mg tablet.  Pt stats that this helps him sleep. This was a Dr.Zhou-wang pt.      Pt message:I can't fall asleep or stay asleep. I've used Ambien in the past with good results. She prescribed Tamazepam which hasn't helped with falling asleep and leaves a hangover effect in the AM.

## 2022-05-27 MED ORDER — ZOLPIDEM TARTRATE 10 MG PO TABS
10 MG | ORAL_TABLET | Freq: Every evening | ORAL | 2 refills | Status: AC
Start: 2022-05-27 — End: 2022-08-25

## 2022-05-27 NOTE — Addendum Note (Signed)
Addended byBretta Bang on: 05/27/2022 12:19 PM     Modules accepted: Orders

## 2022-05-27 NOTE — Telephone Encounter (Signed)
Reviewed patient's history and medication list.  Sent a refill for zolpidem 10 mg nightly for his insomnia.  Attempted to call patient to give an update and inform him.    New prescription should be available at his regular pharmacy.    05/27/2022

## 2022-05-31 NOTE — Addendum Note (Signed)
Addended byBretta Bang on: 05/31/2022 11:39 AM     Modules accepted: Orders

## 2024-05-30 ENCOUNTER — Encounter
